# Patient Record
Sex: Male | Born: 1945 | Race: White | Hispanic: No | Marital: Married | Smoking: Never smoker
Health system: Southern US, Community
[De-identification: ages and names within clinical notes are randomized; demographics above are authoritative.]

## PROBLEM LIST (undated history)

## (undated) DIAGNOSIS — N529 Male erectile dysfunction, unspecified: Secondary | ICD-10-CM

## (undated) DIAGNOSIS — K219 Gastro-esophageal reflux disease without esophagitis: Secondary | ICD-10-CM

## (undated) DIAGNOSIS — M109 Gout, unspecified: Secondary | ICD-10-CM

## (undated) DIAGNOSIS — E559 Vitamin D deficiency, unspecified: Secondary | ICD-10-CM

## (undated) DIAGNOSIS — I10 Essential (primary) hypertension: Secondary | ICD-10-CM

## (undated) DIAGNOSIS — S2220XA Unspecified fracture of sternum, initial encounter for closed fracture: Secondary | ICD-10-CM

## (undated) DIAGNOSIS — E781 Pure hyperglyceridemia: Secondary | ICD-10-CM

## (undated) DIAGNOSIS — E538 Deficiency of other specified B group vitamins: Secondary | ICD-10-CM

## (undated) HISTORY — PX: COLONOSCOPY: SHX174

## (undated) HISTORY — PX: OTHER SURGICAL HISTORY: SHX169

## (undated) HISTORY — PX: TONSILLECTOMY: SUR1361

---

## 2006-03-03 ENCOUNTER — Ambulatory Visit: Payer: Self-pay | Admitting: Gastroenterology

## 2008-10-17 ENCOUNTER — Ambulatory Visit: Payer: Self-pay | Admitting: Internal Medicine

## 2008-10-17 IMAGING — CR LEFT RING FINGER 2+V
1 series · 3 of 3 positions shown · non-contrast
Comparison: none

REASON FOR EXAM: injury
COMMENTS:

[Series 1: view not recorded · 0.17mm/px · 3 of 3 slices shown]
[im 1/3]
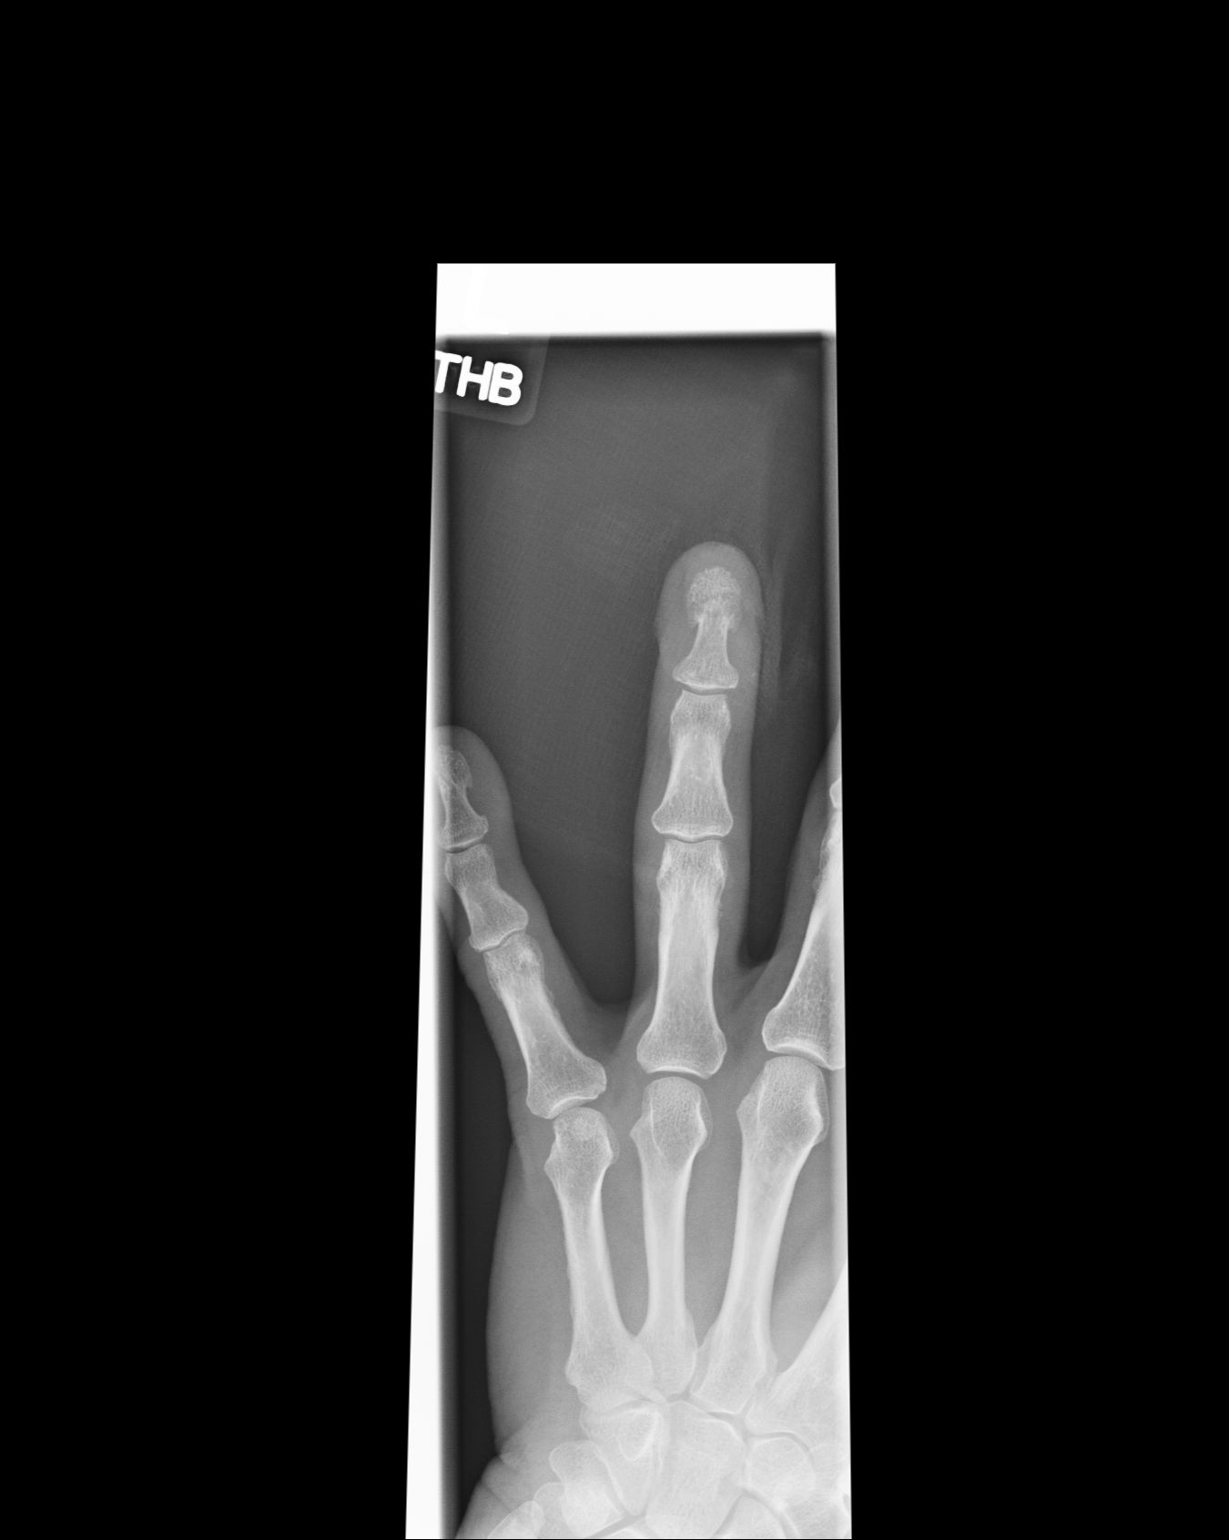
[im 2/3]
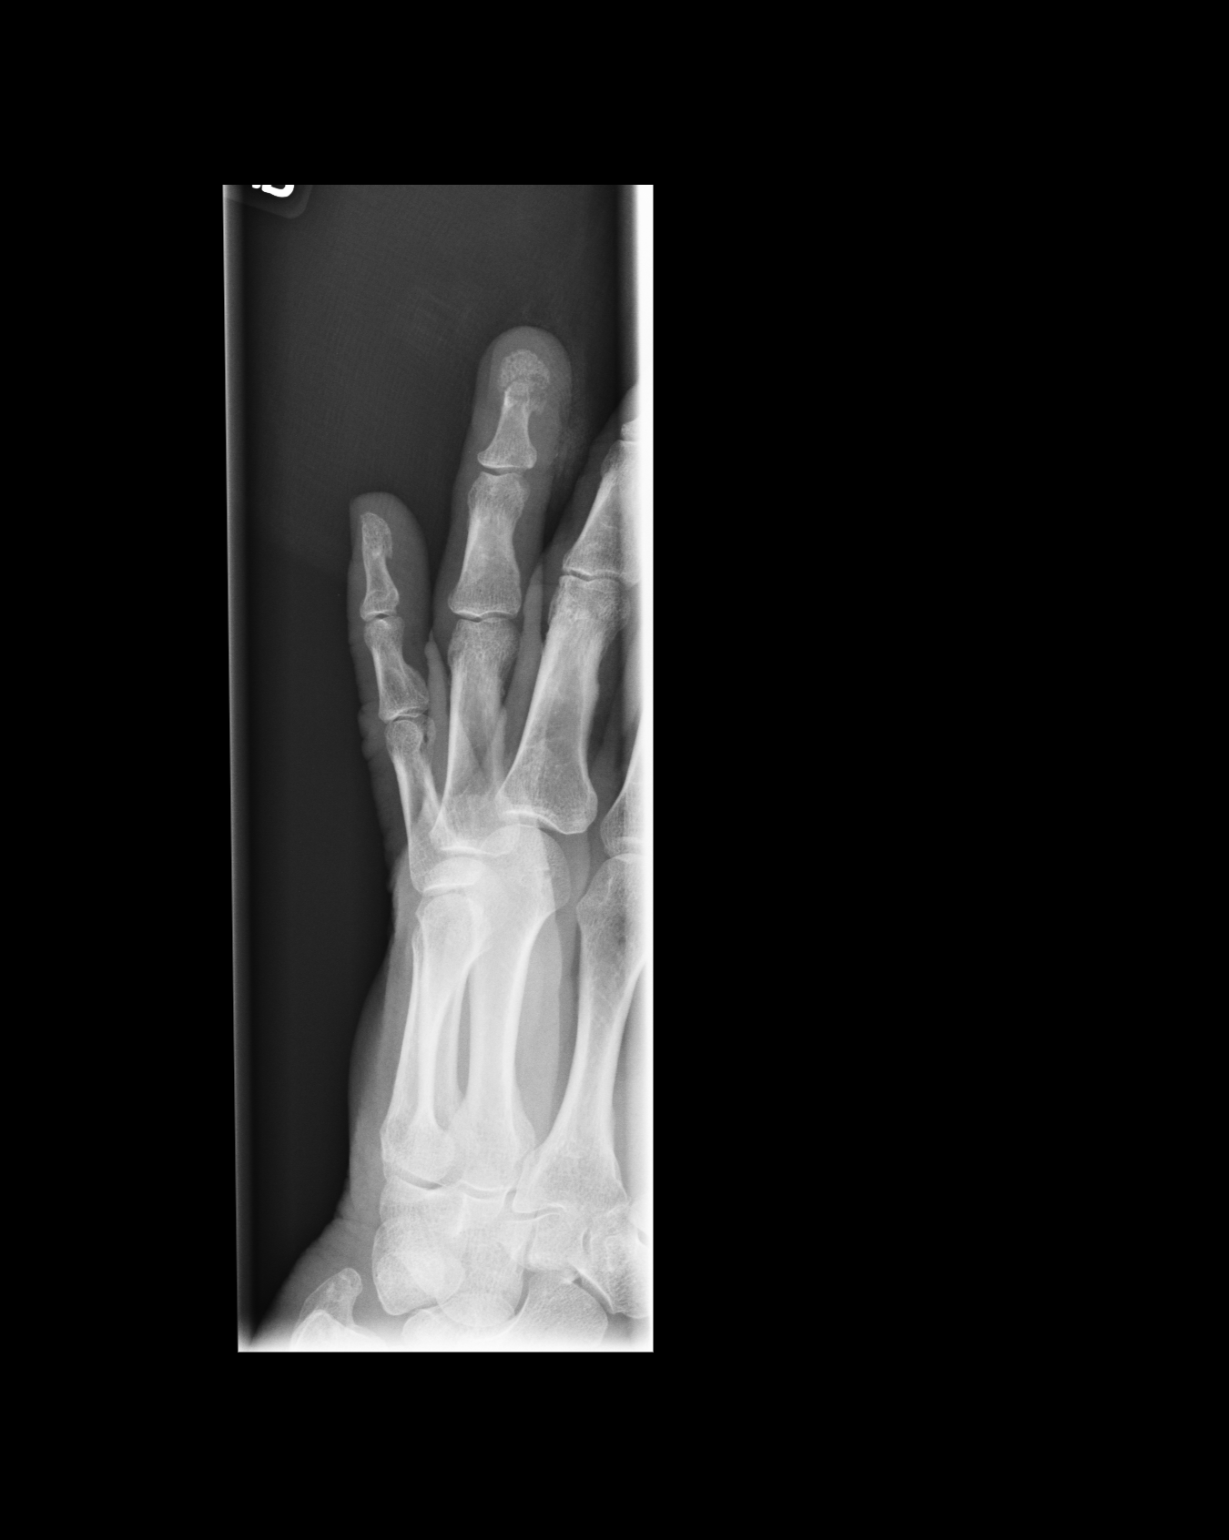
[im 3/3]
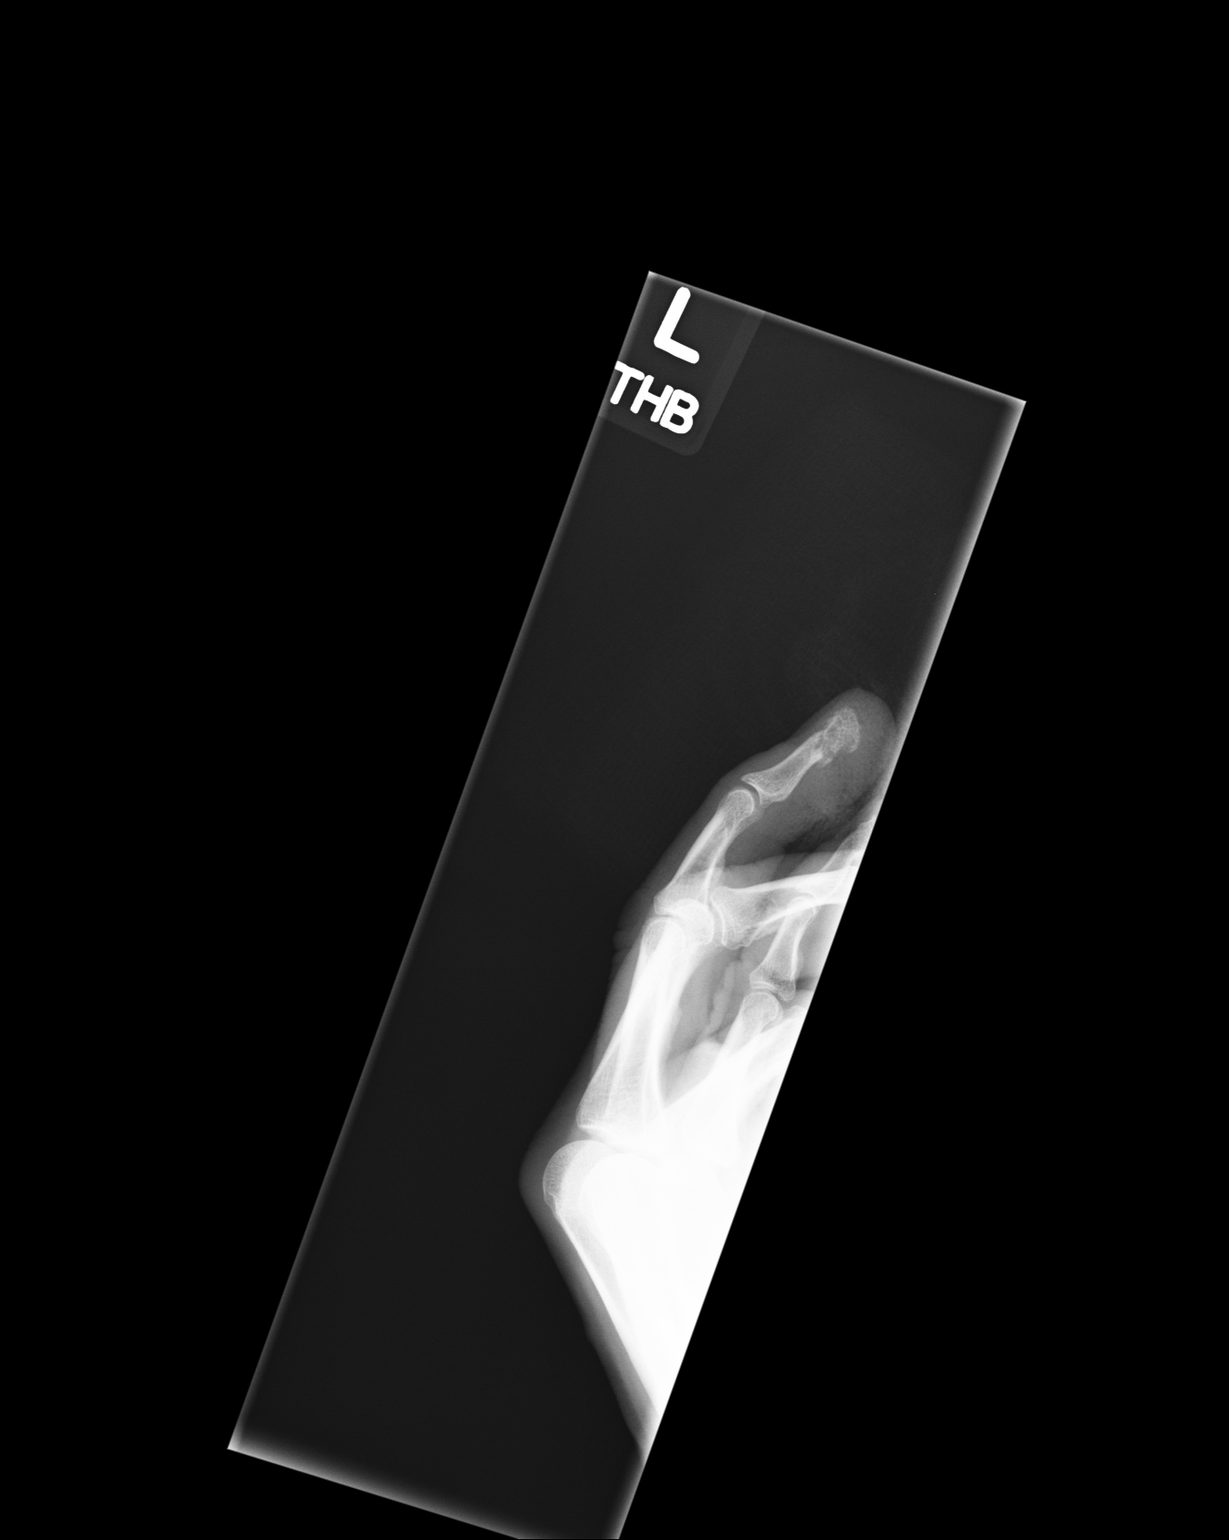

[3 of 3 positions shown; findings below may reference images not displayed]

PROCEDURE:     MDR - MDR FINGER RING 4TH DIG LT HAND  - [DATE]  [DATE]

RESULT:     Images of the LEFT fourth finger demonstrate a transverse
fracture in the tuft of the distal phalanx with mild comminution and some
slight distraction. Slight anterior displacement is present. No radiopaque
foreign body is evident.
IMPRESSION: Please see above.

## 2008-10-26 ENCOUNTER — Ambulatory Visit: Payer: Self-pay | Admitting: Internal Medicine

## 2009-06-25 ENCOUNTER — Ambulatory Visit: Payer: Self-pay | Admitting: Gastroenterology

## 2014-11-01 ENCOUNTER — Ambulatory Visit: Payer: Self-pay | Admitting: Gastroenterology

## 2021-01-23 ENCOUNTER — Other Ambulatory Visit: Payer: Self-pay

## 2021-01-23 ENCOUNTER — Other Ambulatory Visit
Admission: RE | Admit: 2021-01-23 | Discharge: 2021-01-23 | Disposition: A | Discharge status: Home / Self Care | Payer: Medicare Other | Source: Ambulatory Visit | Attending: Gastroenterology | Admitting: Gastroenterology

## 2021-01-23 DIAGNOSIS — Z20822 Contact with and (suspected) exposure to covid-19: Secondary | ICD-10-CM | POA: Diagnosis not present

## 2021-01-23 DIAGNOSIS — Z01812 Encounter for preprocedural laboratory examination: Secondary | ICD-10-CM | POA: Insufficient documentation

## 2021-01-23 LAB — SARS CORONAVIRUS 2 (TAT 6-24 HRS): SARS Coronavirus 2: NEGATIVE

## 2021-01-24 ENCOUNTER — Encounter: Payer: Self-pay | Admitting: *Deleted

## 2021-01-25 ENCOUNTER — Encounter
Admission: RE | Disposition: A | Discharge status: Home / Self Care | Payer: Self-pay | Source: Home / Self Care | Attending: Gastroenterology

## 2021-01-25 ENCOUNTER — Ambulatory Visit: Payer: Medicare Other | Admitting: Certified Registered"

## 2021-01-25 ENCOUNTER — Other Ambulatory Visit: Payer: Self-pay

## 2021-01-25 ENCOUNTER — Ambulatory Visit
Admission: RE | Admit: 2021-01-25 | Discharge: 2021-01-25 | Disposition: A | Discharge status: Home / Self Care | Payer: Medicare Other | Attending: Gastroenterology | Admitting: Gastroenterology

## 2021-01-25 ENCOUNTER — Encounter: Payer: Self-pay | Admitting: *Deleted

## 2021-01-25 DIAGNOSIS — Z1211 Encounter for screening for malignant neoplasm of colon: Secondary | ICD-10-CM | POA: Diagnosis not present

## 2021-01-25 DIAGNOSIS — D122 Benign neoplasm of ascending colon: Secondary | ICD-10-CM | POA: Insufficient documentation

## 2021-01-25 DIAGNOSIS — Z86010 Personal history of colonic polyps: Secondary | ICD-10-CM | POA: Insufficient documentation

## 2021-01-25 DIAGNOSIS — K5730 Diverticulosis of large intestine without perforation or abscess without bleeding: Secondary | ICD-10-CM | POA: Insufficient documentation

## 2021-01-25 DIAGNOSIS — Z79899 Other long term (current) drug therapy: Secondary | ICD-10-CM | POA: Diagnosis not present

## 2021-01-25 DIAGNOSIS — Z7982 Long term (current) use of aspirin: Secondary | ICD-10-CM | POA: Insufficient documentation

## 2021-01-25 DIAGNOSIS — Z800 Family history of malignant neoplasm of digestive organs: Secondary | ICD-10-CM | POA: Insufficient documentation

## 2021-01-25 DIAGNOSIS — K641 Second degree hemorrhoids: Secondary | ICD-10-CM | POA: Insufficient documentation

## 2021-01-25 DIAGNOSIS — D125 Benign neoplasm of sigmoid colon: Secondary | ICD-10-CM | POA: Diagnosis not present

## 2021-01-25 HISTORY — DX: Gastro-esophageal reflux disease without esophagitis: K21.9

## 2021-01-25 HISTORY — DX: Deficiency of other specified B group vitamins: E53.8

## 2021-01-25 HISTORY — DX: Unspecified fracture of sternum, initial encounter for closed fracture: S22.20XA

## 2021-01-25 HISTORY — DX: Male erectile dysfunction, unspecified: N52.9

## 2021-01-25 HISTORY — DX: Essential (primary) hypertension: I10

## 2021-01-25 HISTORY — DX: Pure hyperglyceridemia: E78.1

## 2021-01-25 HISTORY — DX: Gout, unspecified: M10.9

## 2021-01-25 HISTORY — PX: COLONOSCOPY WITH PROPOFOL: SHX5780

## 2021-01-25 HISTORY — DX: Vitamin D deficiency, unspecified: E55.9

## 2021-01-25 SURGERY — COLONOSCOPY WITH PROPOFOL
Anesthesia: General

## 2021-01-25 MED ORDER — LIDOCAINE 2% (20 MG/ML) 5 ML SYRINGE
INTRAMUSCULAR | Status: DC | PRN
  Administered 2021-01-25: 25 mg via INTRAVENOUS

## 2021-01-25 MED ORDER — PROPOFOL 500 MG/50ML IV EMUL
INTRAVENOUS | Status: DC | PRN
  Administered 2021-01-25: 120 ug/kg/min via INTRAVENOUS

## 2021-01-25 MED ORDER — PROPOFOL 10 MG/ML IV BOLUS
INTRAVENOUS | Status: DC | PRN
  Administered 2021-01-25: 70 mg via INTRAVENOUS

## 2021-01-25 MED ORDER — PROPOFOL 500 MG/50ML IV EMUL
INTRAVENOUS | Status: AC
  Filled 2021-01-25: qty 50

## 2021-01-25 MED ORDER — EPHEDRINE SULFATE 50 MG/ML IJ SOLN
INTRAMUSCULAR | Status: DC | PRN
  Administered 2021-01-25: 10 mg via INTRAVENOUS

## 2021-01-25 MED ORDER — SODIUM CHLORIDE (PF) 0.9 % IJ SOLN
INTRAMUSCULAR | Status: DC | PRN
  Administered 2021-01-25: .5 mL via INTRAVENOUS

## 2021-01-25 MED ORDER — SODIUM CHLORIDE 0.9 % IV SOLN: INTRAVENOUS | Status: DC

## 2021-01-25 MED ORDER — EPHEDRINE 5 MG/ML INJ
INTRAVENOUS | Status: AC
  Filled 2021-01-25: qty 10

## 2021-01-25 NOTE — Op Note (Signed)
[facility] Patient Name: Todd Scott Procedure Date: 01/25/2021 10:31 AM MRN: [id number] Account #: [id number] Date of Birth: 11/24/1946 Admit Type: Outpatient Age: 75 Room: [facility] ENDO ROOM 3 Gender: Male Note Status: Finalized Procedure:             Colonoscopy Indications:           High risk colon cancer surveillance: Personal history                         of adenoma with villous component, Family history of                         colon cancer in a first-degree relative before age 63                         years Providers:             Andrey Farmer MD, MD Medicines:             Monitored Anesthesia Care Complications:         No immediate complications. Estimated blood loss:                         Minimal. Procedure:             Pre-Anesthesia Assessment:                        - Prior to the procedure, a History and Physical was                         performed, and patient medications and allergies were                         reviewed. The patient is competent. The risks and                         benefits of the procedure and the sedation options and                         risks were discussed with the patient. All questions                         were answered and informed consent was obtained.                         Patient identification and proposed procedure were                         verified by the physician, the nurse, the anesthetist                         and the technician in the endoscopy suite. Mental                         Status Examination: alert and oriented. Airway                         Examination: normal oropharyngeal airway and neck  mobility. Respiratory Examination: clear to                         auscultation. CV Examination: normal. Prophylactic                         Antibiotics: The patient does not require prophylactic                         antibiotics. Prior  Anticoagulants: The patient has                         taken no previous anticoagulant or antiplatelet                         agents. ASA Grade Assessment: II - A patient with mild                         systemic disease. After reviewing the risks and                         benefits, the patient was deemed in satisfactory                         condition to undergo the procedure. The anesthesia                         plan was to use monitored anesthesia care (MAC).                         Immediately prior to administration of medications,                         the patient was re-assessed for adequacy to receive                         sedatives. The heart rate, respiratory rate, oxygen                         saturations, blood pressure, adequacy of pulmonary                         ventilation, and response to care were monitored                         throughout the procedure. The physical status of the                         patient was re-assessed after the procedure.                        After obtaining informed consent, the colonoscope was                         passed under direct vision. Throughout the procedure,                         the patient's blood pressure, pulse, and oxygen  saturations were monitored continuously. The                         Colonoscope was introduced through the anus and                         advanced to the the cecum, identified by appendiceal                         orifice and ileocecal valve. The colonoscopy was                         performed without difficulty. The patient tolerated                         the procedure well. The quality of the bowel                         preparation was good. Findings:      The perianal and digital rectal examinations were normal.      Two flat polyps were found in the ascending colon. The polyps were 6 to       7 mm in size. These polyps were removed with a saline  injection-lift       technique using a hot snare. Resection and retrieval were complete.       Estimated blood loss was minimal.      A 3 mm polyp was found in the ascending colon. The polyp was sessile.       The polyp was removed with a cold snare. Resection and retrieval were       complete. Estimated blood loss was minimal.      A 4 mm polyp was found in the sigmoid colon. The polyp was pedunculated.       The polyp was removed with a cold snare. Resection and retrieval were       complete. Estimated blood loss was minimal.      A few small-mouthed diverticula were found in the sigmoid colon.      Internal hemorrhoids were found during retroflexion. The hemorrhoids       were Grade II (internal hemorrhoids that prolapse but reduce       spontaneously).      The exam was otherwise without abnormality on direct and retroflexion       views. Impression:            - Two 6 to 7 mm polyps in the ascending colon, removed                         using injection-lift and a hot snare. Resected and                         retrieved.                        - One 3 mm polyp in the ascending colon, removed with                         a cold snare. Resected and retrieved.                        -  One 4 mm polyp in the sigmoid colon, removed with a                         cold snare. Resected and retrieved.                        - Diverticulosis in the sigmoid colon.                        - Internal hemorrhoids.                        - The examination was otherwise normal on direct and                         retroflexion views. Recommendation:        - Discharge patient to home.                        - Resume previous diet.                        - Continue present medications.                        - Await pathology results.                        - Repeat colonoscopy for surveillance based on                         pathology results.                        - Return to referring  physician as previously                         scheduled. Procedure Code(s):     --- Professional ---                        [phone], Colonoscopy, flexible; with removal of                         tumor(s), polyp(s), or other lesion(s) by snare                         technique                        [id number], Colonoscopy, flexible; with directed submucosal                         injection(s), any substance Diagnosis Code(s):     --- Professional ---                        K63.5, Polyp of colon                        Z86.010, Personal history of colonic polyps                        K64.1, Second degree  hemorrhoids                        Z80.0, Family history of malignant neoplasm of                         digestive organs                        K57.30, Diverticulosis of large intestine without                         perforation or abscess without bleeding CPT copyright 2019 American Medical Association. All rights reserved. The codes documented in this report are preliminary and upon coder review may  be revised to meet current compliance requirements. Andrey Farmer MD, MD 01/25/2021 11:13:19 AM Number of Addenda: 0 Note Initiated On: 01/25/2021 10:31 AM Scope Withdrawal Time: 0 hours 21 minutes 40 seconds  Total Procedure Duration: 0 hours 28 minutes 22 seconds  Estimated Blood Loss:  Estimated blood loss was minimal.      [facility]

## 2021-01-25 NOTE — Interval H&P Note (Signed)
History and Physical Interval Note:  01/25/2021 10:31 AM  Todd Scott  has presented today for surgery, with the diagnosis of HX ADEN COLON POLYP.  The various methods of treatment have been discussed with the patient and family. After consideration of risks, benefits and other options for treatment, the patient has consented to  Procedure(s): COLONOSCOPY WITH PROPOFOL (N/A) as a surgical intervention.  The patient's history has been reviewed, patient examined, no change in status, stable for surgery.  I have reviewed the patient's chart and labs.  Questions were answered to the patient's satisfaction.     [name]  Ok to proceed with colonoscopy

## 2021-01-25 NOTE — H&P (Signed)
Outpatient short stay form Pre-procedure 01/25/2021 10:28 AM Todd Miyamoto MD, MPH  Primary Physician: NP Payton Mccallum  Reason for visit:  Surveillance  History of present illness:   75 y/o gentleman with history of advanced polyp and family history of colon cancer in his brother in his 76's. No significant abdominal surgeries. No blood thinners. No new GI symptoms.    Current Facility-Administered Medications:  .  0.9 %  sodium chloride infusion, , Intravenous, Continuous, Charlese Gruetzmacher, Hilton Cork, MD, Last Rate: 20 mL/hr at 01/25/21 1000, New Bag at 01/25/21 1000  Medications Prior to Admission  Medication Sig Dispense Refill Last Dose  . allopurinol (ZYLOPRIM) 300 MG tablet Take 300 mg by mouth daily.   Past Month at Unknown time  . aspirin 81 MG chewable tablet Chew 81 mg by mouth daily.   Past Week at Unknown time  . Boswellia-Glucosamine-Vit D (OSTEO BI-FLEX ONE PER DAY) TABS Take by mouth.   Past Week at Unknown time  . diphenhydramine-acetaminophen (TYLENOL PM) 25-500 MG TABS tablet Take 1 tablet by mouth at bedtime as needed.   Past Week at Unknown time  . fish oil-omega-3 fatty acids 1000 MG capsule Take 2 g by mouth daily.   Past Week at Unknown time  . Misc Natural Products (URINOZINC PO) Take 100 mg by mouth.   Past Week at Unknown time  . psyllium (METAMUCIL) 58.6 % packet Take 1 packet by mouth daily.   Past Week at Unknown time  . tamsulosin (FLOMAX) 0.4 MG CAPS capsule Take 0.4 mg by mouth.   Past Week at Unknown time     No Known Allergies   Past Medical History:  Diagnosis Date  . ED (erectile dysfunction)   . Fractured sternum   . GERD (gastroesophageal reflux disease)   . Gout   . Hypertension   . Hypertriglyceridemia   . Vitamin B12 deficiency   . Vitamin D deficiency     Review of systems:  Otherwise negative.    Physical Exam  Gen: Alert, oriented. Appears stated age.  HEENT: . PERRLA. Lungs: No respiratory distress CV: RRR Abd: soft, benign, no  masses Ext: No edema    Planned procedures: Proceed with colonoscopy. The patient understands the nature of the planned procedure, indications, risks, alternatives and potential complications including but not limited to bleeding, infection, perforation, damage to internal organs and possible oversedation/side effects from anesthesia. The patient agrees and gives consent to proceed.  Please refer to procedure notes for findings, recommendations and patient disposition/instructions.     Todd Miyamoto MD, MPH Gastroenterology 01/25/2021  10:28 AM

## 2021-01-25 NOTE — Anesthesia Postprocedure Evaluation (Signed)
Anesthesia Post Note  Patient: Todd Scott  Procedure(s) Performed: COLONOSCOPY WITH PROPOFOL (N/A )  Patient location during evaluation: Endoscopy Anesthesia Type: General Level of consciousness: awake and alert and oriented Pain management: pain level controlled Vital Signs Assessment: post-procedure vital signs reviewed and stable Respiratory status: spontaneous breathing, nonlabored ventilation and respiratory function stable Cardiovascular status: blood pressure returned to baseline and stable Postop Assessment: no signs of nausea or vomiting Anesthetic complications: no   No complications documented.   Last Vitals:  Vitals:   01/25/21 1129 01/25/21 1144  BP: 112/72 123/76  Pulse: (!) 58 (!) 58  Resp:    Temp:    SpO2: 100% 100%    Last Pain:  Vitals:   01/25/21 1144  TempSrc:   PainSc: 0-No pain                 Astor Gentle

## 2021-01-25 NOTE — Transfer of Care (Signed)
Immediate Anesthesia Transfer of Care Note  Patient: Todd Scott  Procedure(s) Performed: COLONOSCOPY WITH PROPOFOL (N/A )  Patient Location: Endoscopy Unit  Anesthesia Type:General  Level of Consciousness: drowsy  Airway & Oxygen Therapy: Patient Spontanous Breathing  Post-op Assessment: Report given to RN and Post -op Vital signs reviewed and stable  Post vital signs: Reviewed  Last Vitals:  Vitals Value Taken Time  BP 91/63 01/25/21 1109  Temp    Pulse 65 01/25/21 1110  Resp 12 01/25/21 1110  SpO2 97 % 01/25/21 1110  Vitals shown include unvalidated device data.  Last Pain:  Vitals:   01/25/21 0939  TempSrc: Temporal  PainSc: 0-No pain         Complications: No complications documented.

## 2021-01-25 NOTE — Anesthesia Preprocedure Evaluation (Signed)
Anesthesia Evaluation  Patient identified by MRN, date of birth, ID band Patient awake    Reviewed: Allergy & Precautions, NPO status , Patient's Chart, lab work & pertinent test results  History of Anesthesia Complications Negative for: history of anesthetic complications  Airway Mallampati: II  TM Distance: >3 FB Neck ROM: Full    Dental no notable dental hx.    Pulmonary neg pulmonary ROS, neg sleep apnea, neg COPD,    breath sounds clear to auscultation- rhonchi (-) wheezing      Cardiovascular Exercise Tolerance: Good hypertension, Pt. on medications (-) CAD, (-) Past MI, (-) Cardiac Stents and (-) CABG  Rhythm:Regular Rate:Normal - Systolic murmurs and - Diastolic murmurs    Neuro/Psych neg Seizures negative neurological ROS  negative psych ROS   GI/Hepatic Neg liver ROS, GERD  ,  Endo/Other  negative endocrine ROSneg diabetes  Renal/GU negative Renal ROS     Musculoskeletal negative musculoskeletal ROS (+)   Abdominal (+) - obese,   Peds  Hematology negative hematology ROS (+)   Anesthesia Other Findings Past Medical History: No date: ED (erectile dysfunction) No date: Fractured sternum No date: GERD (gastroesophageal reflux disease) No date: Gout No date: Hypertension No date: Hypertriglyceridemia No date: Vitamin B12 deficiency No date: Vitamin D deficiency   Reproductive/Obstetrics                             Anesthesia Physical Anesthesia Plan  ASA: II  Anesthesia Plan: General   Post-op Pain Management:    Induction: Intravenous  PONV Risk Score and Plan: 1 and Propofol infusion  Airway Management Planned: Natural Airway  Additional Equipment:   Intra-op Plan:   Post-operative Plan:   Informed Consent: I have reviewed the patients History and Physical, chart, labs and discussed the procedure including the risks, benefits and alternatives for the proposed  anesthesia with the patient or authorized representative who has indicated his/her understanding and acceptance.     Dental advisory given  Plan Discussed with: CRNA and Anesthesiologist  Anesthesia Plan Comments:         Anesthesia Quick Evaluation

## 2021-01-28 ENCOUNTER — Encounter: Payer: Self-pay | Admitting: Gastroenterology

## 2021-01-28 LAB — SURGICAL PATHOLOGY

## 2022-05-05 ENCOUNTER — Other Ambulatory Visit: Payer: Self-pay | Admitting: Gerontology

## 2022-05-05 ENCOUNTER — Other Ambulatory Visit (HOSPITAL_COMMUNITY): Payer: Self-pay | Admitting: Gerontology

## 2022-05-05 DIAGNOSIS — R410 Disorientation, unspecified: Secondary | ICD-10-CM

## 2022-05-05 DIAGNOSIS — F688 Other specified disorders of adult personality and behavior: Secondary | ICD-10-CM

## 2022-05-05 DIAGNOSIS — K4090 Unilateral inguinal hernia, without obstruction or gangrene, not specified as recurrent: Secondary | ICD-10-CM

## 2022-05-16 ENCOUNTER — Ambulatory Visit
Admission: RE | Admit: 2022-05-16 | Discharge: 2022-05-16 | Disposition: A | Discharge status: Home / Self Care | Payer: Medicare Other | Source: Ambulatory Visit | Attending: Gerontology | Admitting: Gerontology

## 2022-05-16 DIAGNOSIS — R410 Disorientation, unspecified: Secondary | ICD-10-CM | POA: Insufficient documentation

## 2022-05-16 DIAGNOSIS — F688 Other specified disorders of adult personality and behavior: Secondary | ICD-10-CM | POA: Diagnosis present

## 2022-05-16 DIAGNOSIS — K4090 Unilateral inguinal hernia, without obstruction or gangrene, not specified as recurrent: Secondary | ICD-10-CM | POA: Insufficient documentation

## 2022-05-16 LAB — POCT I-STAT CREATININE: Creatinine, Ser: 1.3 mg/dL — ABNORMAL HIGH (ref 0.61–1.24)

## 2022-05-16 MED ORDER — IOHEXOL 350 MG/ML SOLN
75.0000 mL | Freq: Once | INTRAVENOUS | Status: AC | PRN
  Administered 2022-05-16: 75 mL via INTRAVENOUS

## 2022-05-16 MED ORDER — GADOBUTROL 1 MMOL/ML IV SOLN
7.5000 mL | Freq: Once | INTRAVENOUS | Status: AC | PRN
Start: 2022-05-16 — End: 2022-05-16
  Administered 2022-05-16: 7.5 mL via INTRAVENOUS

## 2022-09-15 ENCOUNTER — Encounter: Admission: RE | Payer: Self-pay | Source: Home / Self Care

## 2022-09-15 ENCOUNTER — Other Ambulatory Visit: Payer: Self-pay | Admitting: Gastroenterology

## 2022-09-15 ENCOUNTER — Ambulatory Visit: Admission: RE | Admit: 2022-09-15 | Payer: Medicare Other | Source: Home / Self Care

## 2022-09-15 DIAGNOSIS — R1013 Epigastric pain: Secondary | ICD-10-CM

## 2022-09-15 SURGERY — ESOPHAGOGASTRODUODENOSCOPY (EGD) WITH PROPOFOL
Anesthesia: General

## 2022-09-19 ENCOUNTER — Ambulatory Visit
Admission: RE | Admit: 2022-09-19 | Discharge: 2022-09-19 | Disposition: A | Discharge status: Home / Self Care | Payer: Medicare Other | Source: Ambulatory Visit | Attending: Gastroenterology | Admitting: Gastroenterology

## 2022-09-19 DIAGNOSIS — R1013 Epigastric pain: Secondary | ICD-10-CM | POA: Diagnosis present

## 2022-09-30 ENCOUNTER — Encounter
Admission: RE | Disposition: A | Discharge status: Home / Self Care | Payer: Self-pay | Source: Home / Self Care | Attending: Gastroenterology

## 2022-09-30 ENCOUNTER — Ambulatory Visit: Payer: Medicare Other | Admitting: Anesthesiology

## 2022-09-30 ENCOUNTER — Encounter: Payer: Self-pay | Admitting: *Deleted

## 2022-09-30 ENCOUNTER — Ambulatory Visit
Admission: RE | Admit: 2022-09-30 | Discharge: 2022-09-30 | Disposition: A | Discharge status: Home / Self Care | Payer: Medicare Other | Attending: Gastroenterology | Admitting: Gastroenterology

## 2022-09-30 DIAGNOSIS — R933 Abnormal findings on diagnostic imaging of other parts of digestive tract: Secondary | ICD-10-CM | POA: Insufficient documentation

## 2022-09-30 DIAGNOSIS — Z8711 Personal history of peptic ulcer disease: Secondary | ICD-10-CM | POA: Diagnosis not present

## 2022-09-30 DIAGNOSIS — K449 Diaphragmatic hernia without obstruction or gangrene: Secondary | ICD-10-CM | POA: Diagnosis not present

## 2022-09-30 DIAGNOSIS — R1013 Epigastric pain: Secondary | ICD-10-CM | POA: Diagnosis present

## 2022-09-30 DIAGNOSIS — K3189 Other diseases of stomach and duodenum: Secondary | ICD-10-CM | POA: Diagnosis not present

## 2022-09-30 DIAGNOSIS — K2950 Unspecified chronic gastritis without bleeding: Secondary | ICD-10-CM | POA: Diagnosis not present

## 2022-09-30 DIAGNOSIS — F0280 Dementia in other diseases classified elsewhere without behavioral disturbance: Secondary | ICD-10-CM | POA: Insufficient documentation

## 2022-09-30 DIAGNOSIS — K2980 Duodenitis without bleeding: Secondary | ICD-10-CM | POA: Diagnosis not present

## 2022-09-30 DIAGNOSIS — G3183 Dementia with Lewy bodies: Secondary | ICD-10-CM | POA: Insufficient documentation

## 2022-09-30 HISTORY — PX: ESOPHAGOGASTRODUODENOSCOPY (EGD) WITH PROPOFOL: SHX5813

## 2022-09-30 SURGERY — ESOPHAGOGASTRODUODENOSCOPY (EGD) WITH PROPOFOL
Anesthesia: General

## 2022-09-30 MED ORDER — LIDOCAINE 2% (20 MG/ML) 5 ML SYRINGE
INTRAMUSCULAR | Status: DC | PRN
  Administered 2022-09-30: 100 mg via INTRAVENOUS

## 2022-09-30 MED ORDER — LIDOCAINE HCL (PF) 2 % IJ SOLN
INTRAMUSCULAR | Status: AC
  Filled 2022-09-30: qty 5

## 2022-09-30 MED ORDER — PROPOFOL 10 MG/ML IV BOLUS
INTRAVENOUS | Status: DC | PRN
  Administered 2022-09-30: 70 mg via INTRAVENOUS
  Administered 2022-09-30: 30 mg via INTRAVENOUS

## 2022-09-30 MED ORDER — PROPOFOL 500 MG/50ML IV EMUL
INTRAVENOUS | Status: DC | PRN
  Administered 2022-09-30: 150 ug/kg/min via INTRAVENOUS

## 2022-09-30 MED ORDER — SODIUM CHLORIDE 0.9 % IV SOLN
INTRAVENOUS | Status: DC
  Administered 2022-09-30: 1000 mL via INTRAVENOUS

## 2022-09-30 NOTE — Op Note (Signed)
[facility] Gastroenterology Patient Name: Todd Scott Procedure Date: 09/30/2022 9:33 AM MRN: [id number] Account #: [id number] Date of Birth: 1946-08-11 Admit Type: Outpatient Age: 76 Room: [facility] ENDO ROOM 1 Gender: Male Note Status: Finalized Instrument Name: Upper Endoscope [id number] Procedure:             Upper GI endoscopy Indications:           Dyspepsia, Abnormal UGI series Providers:             Andrey Farmer MD, MD Medicines:             Monitored Anesthesia Care Complications:         No immediate complications. Estimated blood loss:                         Minimal. Procedure:             Pre-Anesthesia Assessment:                        - Prior to the procedure, a History and Physical was                         performed, and patient medications and allergies were                         reviewed. The patient is competent. The risks and                         benefits of the procedure and the sedation options and                         risks were discussed with the patient. All questions                         were answered and informed consent was obtained.                         Patient identification and proposed procedure were                         verified by the physician, the nurse, the                         anesthesiologist, the anesthetist and the technician                         in the endoscopy suite. Mental Status Examination:                         alert and oriented. Airway Examination: normal                         oropharyngeal airway and neck mobility. Respiratory                         Examination: clear to auscultation. CV Examination:                         normal. Prophylactic Antibiotics: The patient does not  require prophylactic antibiotics. Prior                         Anticoagulants: The patient has taken no anticoagulant                         or antiplatelet agents except for aspirin.  ASA Grade                         Assessment: III - A patient with severe systemic                         disease. After reviewing the risks and benefits, the                         patient was deemed in satisfactory condition to                         undergo the procedure. The anesthesia plan was to use                         monitored anesthesia care (MAC). Immediately prior to                         administration of medications, the patient was                         re-assessed for adequacy to receive sedatives. The                         heart rate, respiratory rate, oxygen saturations,                         blood pressure, adequacy of pulmonary ventilation, and                         response to care were monitored throughout the                         procedure. The physical status of the patient was                         re-assessed after the procedure.                        After obtaining informed consent, the endoscope was                         passed under direct vision. Throughout the procedure,                         the patient's blood pressure, pulse, and oxygen                         saturations were monitored continuously. The Endoscope                         was introduced through the mouth, and advanced to the  second part of duodenum. The upper GI endoscopy was                         accomplished without difficulty. The patient tolerated                         the procedure well. Findings:      A small hiatal hernia was present.      The exam of the esophagus was otherwise normal.      Patchy mild inflammation characterized by erythema was found in the       entire examined stomach. Biopsies were taken with a cold forceps for       Helicobacter pylori testing. Estimated blood loss was minimal.      Patchy moderately erythematous mucosa without active bleeding and with       no stigmata of bleeding was found in the duodenal  bulb. Biopsies were       taken with a cold forceps for histology. Estimated blood loss was       minimal.      The exam of the duodenum was otherwise normal. Impression:            - Small hiatal hernia.                        - Gastritis. Biopsied.                        - Erythematous duodenopathy. Biopsied. Recommendation:        - Discharge patient to home.                        - Resume previous diet.                        - Continue present medications.                        - Await pathology results.                        - Return to referring physician as previously                         scheduled. Procedure Code(s):     --- Professional ---                        [phone], Esophagogastroduodenoscopy, flexible,                         transoral; with biopsy, single or multiple Diagnosis Code(s):     --- Professional ---                        K44.9, Diaphragmatic hernia without obstruction or                         gangrene                        K29.70, Gastritis, unspecified, without bleeding  K31.89, Other diseases of stomach and duodenum                        R10.13, Epigastric pain                        R93.3, Abnormal findings on diagnostic imaging of                         other parts of digestive tract CPT copyright 2022 American Medical Association. All rights reserved. The codes documented in this report are preliminary and upon coder review may  be revised to meet current compliance requirements. Andrey Farmer MD, MD 09/30/2022 9:57:46 AM Number of Addenda: 0 Note Initiated On: 09/30/2022 9:33 AM Estimated Blood Loss:  Estimated blood loss was minimal.      [facility]

## 2022-09-30 NOTE — H&P (Signed)
Outpatient short stay form Pre-procedure 09/30/2022  [name], MD  Primary Physician: Latanya Maudlin, NP  Reason for visit:  Abnormal imaging  History of present illness:    76 y/o gentleman with recently diagnosed lewy-body dementia here for EGD for dyspeptic symptoms and UGI series showing abdominal ulcerations. History of h pylori, eradication not confirmed. No blood thinners. No family history of GI malignancies. No significant abdominal or neck surgeries.    Current Facility-Administered Medications:    0.9 %  sodium chloride infusion, , Intravenous, Continuous, Fahima Cifelli, Hilton Cork, MD  Medications Prior to Admission  Medication Sig Dispense Refill Last Dose   allopurinol (ZYLOPRIM) 300 MG tablet Take 300 mg by mouth daily.   09/29/2022   aspirin 81 MG chewable tablet Chew 81 mg by mouth daily.   09/29/2022   Boswellia-Glucosamine-Vit D (OSTEO BI-FLEX ONE PER DAY) TABS Take by mouth.   09/29/2022   diphenhydramine-acetaminophen (TYLENOL PM) 25-500 MG TABS tablet Take 1 tablet by mouth at bedtime as needed.   09/29/2022   fish oil-omega-3 fatty acids 1000 MG capsule Take 2 g by mouth daily.   09/29/2022   Misc Natural Products (URINOZINC PO) Take 100 mg by mouth.   09/29/2022   psyllium (METAMUCIL) 58.6 % packet Take 1 packet by mouth daily.   09/29/2022   tamsulosin (FLOMAX) 0.4 MG CAPS capsule Take 0.4 mg by mouth.   09/29/2022     No Known Allergies   Past Medical History:  Diagnosis Date   ED (erectile dysfunction)    Fractured sternum    GERD (gastroesophageal reflux disease)    Gout    Hypertension    Hypertriglyceridemia    Vitamin B12 deficiency    Vitamin D deficiency     Review of systems:  Otherwise negative.    Physical Exam  Gen: Alert, oriented. Appears stated age.  HEENT: PERRLA. Lungs: No respiratory distress CV: RRR Abd: soft, benign, no masses Ext: No edema    Planned procedures: Proceed with EGD. The patient understands the nature  of the planned procedure, indications, risks, alternatives and potential complications including but not limited to bleeding, infection, perforation, damage to internal organs and possible oversedation/side effects from anesthesia. The patient agrees and gives consent to proceed.  Please refer to procedure notes for findings, recommendations and patient disposition/instructions.     [name], MD [facility] Gastroenterology

## 2022-09-30 NOTE — Interval H&P Note (Signed)
History and Physical Interval Note:  09/30/2022 9:42 AM  Todd Scott  has presented today for surgery, with the diagnosis of Abnormal UGI series (R93.3)H. pylori infection (A04.8) Weight loss (R63.4).  The various methods of treatment have been discussed with the patient and family. After consideration of risks, benefits and other options for treatment, the patient has consented to  Procedure(s): ESOPHAGOGASTRODUODENOSCOPY (EGD) WITH PROPOFOL (N/A) as a surgical intervention.  The patient's history has been reviewed, patient examined, no change in status, stable for surgery.  I have reviewed the patient's chart and labs.  Questions were answered to the patient's satisfaction.     [name]  Ok to proceed with EGD.

## 2022-09-30 NOTE — Transfer of Care (Signed)
Immediate Anesthesia Transfer of Care Note  Patient: Todd Scott  Procedure(s) Performed: ESOPHAGOGASTRODUODENOSCOPY (EGD) WITH PROPOFOL  Patient Location: Endoscopy Unit  Anesthesia Type:General  Level of Consciousness: sedated  Airway & Oxygen Therapy: Patient Spontanous Breathing  Post-op Assessment: Report given to RN and Post -op Vital signs reviewed and stable  Post vital signs: Reviewed and stable  Last Vitals:  Vitals Value Taken Time  BP 88/61 09/30/22 0959  Temp 36.1 C 09/30/22 0959  Pulse 59 09/30/22 0959  Resp 13 09/30/22 0959  SpO2 100 % 09/30/22 0959    Last Pain:  Vitals:   09/30/22 0959  TempSrc: Temporal  PainSc: Asleep         Complications: No notable events documented.

## 2022-09-30 NOTE — Anesthesia Postprocedure Evaluation (Signed)
Anesthesia Post Note  Patient: Todd Scott  Procedure(s) Performed: ESOPHAGOGASTRODUODENOSCOPY (EGD) WITH PROPOFOL  Anesthesia Type: General Anesthetic complications: no   No notable events documented.   Last Vitals:  Vitals:   09/30/22 1009 09/30/22 1019  BP: 93/66 116/69  Pulse: (!) 59 61  Resp: 14 15  Temp:    SpO2: 100% 100%    Last Pain:  Vitals:   09/30/22 1019  TempSrc:   PainSc: 0-No pain                 Molli Barrows

## 2022-09-30 NOTE — Anesthesia Preprocedure Evaluation (Signed)
Anesthesia Evaluation  Patient identified by MRN, date of birth, ID band Patient awake    Reviewed: Allergy & Precautions, H&P , NPO status , Patient's Chart, lab work & pertinent test results, reviewed documented beta blocker date and time   Airway Mallampati: II   Neck ROM: full    Dental  (+) Poor Dentition   Pulmonary neg pulmonary ROS   Pulmonary exam normal        Cardiovascular Exercise Tolerance: Good hypertension, On Medications negative cardio ROS Normal cardiovascular exam Rhythm:regular Rate:Normal     Neuro/Psych negative neurological ROS  negative psych ROS   GI/Hepatic Neg liver ROS, PUD,GERD  ,,  Endo/Other  negative endocrine ROS    Renal/GU negative Renal ROS  negative genitourinary   Musculoskeletal   Abdominal   Peds  Hematology negative hematology ROS (+)   Anesthesia Other Findings Past Medical History: No date: ED (erectile dysfunction) No date: Fractured sternum No date: GERD (gastroesophageal reflux disease) No date: Gout No date: Hypertension No date: Hypertriglyceridemia No date: Vitamin B12 deficiency No date: Vitamin D deficiency Past Surgical History: No date: COLONOSCOPY 01/25/2021: COLONOSCOPY WITH PROPOFOL; N/A     Comment:  Procedure: COLONOSCOPY WITH PROPOFOL;  Surgeon:               [name], MD;  Location: [facility] ENDOSCOPY;                Service: Endoscopy;  Laterality: N/A; No date: colonoscopy x2 No date: TONSILLECTOMY BMI    Body Mass Index: 21.54 kg/m     Reproductive/Obstetrics negative OB ROS                             Anesthesia Physical Anesthesia Plan  ASA: 2  Anesthesia Plan: General   Post-op Pain Management:    Induction:   PONV Risk Score and Plan:   Airway Management Planned:   Additional Equipment:   Intra-op Plan:   Post-operative Plan:   Informed Consent: I have reviewed the patients History and  Physical, chart, labs and discussed the procedure including the risks, benefits and alternatives for the proposed anesthesia with the patient or authorized representative who has indicated his/her understanding and acceptance.     Dental Advisory Given  Plan Discussed with: CRNA  Anesthesia Plan Comments:        Anesthesia Quick Evaluation

## 2022-10-01 ENCOUNTER — Encounter: Payer: Self-pay | Admitting: Gastroenterology

## 2022-10-06 LAB — SURGICAL PATHOLOGY

## 2023-04-02 ENCOUNTER — Telehealth: Payer: Self-pay

## 2023-04-02 NOTE — Telephone Encounter (Signed)
Palliative care outreach to review and discuss new pc referral. SW LVM on patients home home. SW also LVM on spouse mobile number.

## 2023-04-03 ENCOUNTER — Telehealth: Payer: Self-pay

## 2023-04-03 NOTE — Telephone Encounter (Signed)
INCOMING CALL: patients daughter, Jesse [facility] SW to discuss new PC referral from VM left for patients spouse.   Referral reviewed and discussed as well as criteria and coverage of territory. Family would like in person support. [facility] only offers virtual support to patients outside of [address] and [employer]. Referring provider made aware.

## 2023-07-26 DEATH — deceased
